# Patient Record
Sex: Male | Born: 1975 | Race: White | Hispanic: No | Marital: Married | Smoking: Current some day smoker
Health system: Southern US, Community
[De-identification: ages and names within clinical notes are randomized; demographics above are authoritative.]

---

## 2012-12-18 ENCOUNTER — Ambulatory Visit: Payer: Self-pay | Admitting: Unknown Physician Specialty

## 2014-10-28 ENCOUNTER — Ambulatory Visit
Admission: EM | Admit: 2014-10-28 | Discharge: 2014-10-28 | Disposition: A | Discharge status: Home / Self Care | Payer: BLUE CROSS/BLUE SHIELD | Attending: Family Medicine | Admitting: Family Medicine

## 2014-10-28 ENCOUNTER — Ambulatory Visit: Payer: BLUE CROSS/BLUE SHIELD

## 2014-10-28 DIAGNOSIS — R05 Cough: Secondary | ICD-10-CM | POA: Diagnosis present

## 2014-10-28 DIAGNOSIS — J209 Acute bronchitis, unspecified: Secondary | ICD-10-CM | POA: Diagnosis not present

## 2014-10-28 DIAGNOSIS — F17210 Nicotine dependence, cigarettes, uncomplicated: Secondary | ICD-10-CM | POA: Diagnosis not present

## 2014-10-28 MED ORDER — AEROCHAMBER PLUS FLO-VU LARGE MISC: 1.0000 | Freq: Once | Status: AC

## 2014-10-28 MED ORDER — PREDNISONE 10 MG PO TABS: ORAL_TABLET | ORAL | Status: AC

## 2014-10-28 MED ORDER — ALBUTEROL SULFATE HFA 108 (90 BASE) MCG/ACT IN AERS: 2.0000 | INHALATION_SPRAY | RESPIRATORY_TRACT | Status: AC | PRN

## 2014-10-28 MED ORDER — IPRATROPIUM-ALBUTEROL 0.5-2.5 (3) MG/3ML IN SOLN
3.0000 mL | Freq: Once | RESPIRATORY_TRACT | Status: AC
  Administered 2014-10-28: 3 mL via RESPIRATORY_TRACT

## 2014-10-28 MED ORDER — AZITHROMYCIN 250 MG PO TABS: ORAL_TABLET | ORAL | Status: AC

## 2014-10-28 MED ORDER — HYDROCOD POLST-CPM POLST ER 10-8 MG/5ML PO SUER: 5.0000 mL | Freq: Two times a day (BID) | ORAL | Status: AC | PRN

## 2014-10-28 NOTE — ED Notes (Signed)
Started last Thursday with slight cough. Now moved "deep into my chest". Denies fever

## 2014-10-28 NOTE — Discharge Instructions (Signed)

## 2014-10-28 NOTE — ED Provider Notes (Signed)
CSN: [id number]     Arrival date & time 10/28/14  0802 History   First MD Initiated Contact with Patient 10/28/14 [phone]     Chief Complaint  Patient presents with  . Cough   (Consider location/radiation/quality/duration/timing/severity/associated sxs/prior Treatment) HPI       39 year old male presents complaining of cough. This started initially 6 days ago. He had a mild dry cough with some mild fatigue for about 4 days. 2 days ago the cough got a lot worse. Now he is experiencing chest pain with coughing. He has a heaviness sensation in his chest after coughing as well. He has had a couple of episodes of posttussive vomiting. He has close contacts with similar illness, his boss had bronchitis which turned into pneumonia eventually. He denies fever, chills, NVD, or shortness of breath. No recent travel. Over-the-counter medications are not helping. He smokes about 1 pack of cigarettes a day.  History reviewed. No pertinent past medical history. History reviewed. No pertinent past surgical history. History reviewed. No pertinent family history. History  Substance Use Topics  . Smoking status: Current Some Day Smoker -- 0.50 packs/day  . Smokeless tobacco: Not on file  . Alcohol Use: Yes     Comment: socially    Review of Systems  Constitutional: Positive for fatigue. Negative for fever and chills.  HENT: Positive for congestion. Negative for rhinorrhea and sore throat.   Respiratory: Positive for cough and chest tightness. Negative for shortness of breath.   Cardiovascular: Negative for chest pain and leg swelling.  Gastrointestinal: Negative for nausea, vomiting, abdominal pain and diarrhea.  Skin: Negative for rash.  All other systems reviewed and are negative.   Allergies  Shrimp  Home Medications   Prior to Admission medications   Medication Sig Start Date End Date Taking? Authorizing Provider  albuterol (PROVENTIL HFA;VENTOLIN HFA) 108 (90 BASE) MCG/ACT inhaler Inhale 2  puffs into the lungs every 4 (four) hours as needed for wheezing. 10/28/14   Graylon GoodZachary H Joevon Holliman, PA-C  azithromycin (ZITHROMAX Z-PAK) 250 MG tablet Use as directed 10/28/14   Graylon GoodZachary H Mate Alegria, PA-C  chlorpheniramine-HYDROcodone (TUSSIONEX PENNKINETIC ER) 10-8 MG/5ML SUER Take 5 mLs by mouth every 12 (twelve) hours as needed for cough. 10/28/14   Graylon GoodZachary H Finnbar Cedillos, PA-C  predniSONE (DELTASONE) 10 MG tablet 4 tabs PO QD for 4 days; 3 tabs PO QD for 3 days; 2 tabs PO QD for 2 days; 1 tab PO QD for 1 day 10/28/14   Graylon GoodZachary H Elie Leppo, PA-C  Spacer/Aero-Holding Chambers (AEROCHAMBER PLUS FLO-VU LARGE) MISC 1 each by Other route once. 10/28/14   Adrian BlackwaterZachary H Eldene Plocher, PA-C   BP 126/75 mmHg  Pulse 68  Temp(Src) 97.7 F (36.5 C) (Oral)  Resp 18  Ht 5\' 6"  (1.676 m)  Wt 165 lb (74.844 kg)  BMI 26.64 kg/m2  SpO2 100% Physical Exam  Constitutional: He is oriented to person, place, and time. He appears well-developed and well-nourished. He appears distressed (uncomfortable appearing).  HENT:  Head: Normocephalic and atraumatic.  Right Ear: External ear normal.  Left Ear: External ear normal.  Nose: Nose normal. Right sinus exhibits no maxillary sinus tenderness and no frontal sinus tenderness. Left sinus exhibits no maxillary sinus tenderness and no frontal sinus tenderness.  Mouth/Throat: Oropharynx is clear and moist. No oropharyngeal exudate.  Eyes: Conjunctivae are normal.  Neck: Normal range of motion. Neck supple.  Cardiovascular: Normal rate, regular rhythm and normal heart sounds.   Pulmonary/Chest: Effort normal and breath sounds normal. No respiratory  distress. He has no wheezes. He has no rales.  Lymphadenopathy:    He has no cervical adenopathy.  Neurological: He is alert and oriented to person, place, and time. Coordination normal.  Skin: Skin is warm and dry. No rash noted. He is not diaphoretic.  Psychiatric: He has a normal mood and affect. Judgment normal.  Nursing note and vitals reviewed.   ED  Course  Procedures (including critical care time) Labs Review Labs Reviewed - No data to display  Imaging Review Dg Chest 2 View  10/28/2014   CLINICAL DATA:  Cough, shortness of breath for 3 days  EXAM: CHEST  2 VIEW  COMPARISON:  None.  FINDINGS: Cardiomediastinal silhouette is unremarkable. No acute infiltrate or pleural effusion. No pulmonary edema. Minimal degenerative changes lower thoracic spine.  IMPRESSION: No active cardiopulmonary disease.   Electronically Signed   By: Natasha Mead M.D.   On: 10/28/2014 09:00     MDM   1. Acute bronchitis, unspecified organism    He had minimal symptomatic improvement with the DuoNeb nebulizer treatment. No radiographic evidence of pneumonia. However, he is a heavy smoker smoking about a pack a day. Given that, we will go ahead and treat with antibiotics. Also treat symptomatically. Follow-up if worsening   Meds ordered this encounter  Medications  . ipratropium-albuterol (DUONEB) 0.5-2.5 (3) MG/3ML nebulizer solution 3 mL    Sig:   . azithromycin (ZITHROMAX Z-PAK) 250 MG tablet    Sig: Use as directed    Dispense:  6 each    Refill:  0  . predniSONE (DELTASONE) 10 MG tablet    Sig: 4 tabs PO QD for 4 days; 3 tabs PO QD for 3 days; 2 tabs PO QD for 2 days; 1 tab PO QD for 1 day    Dispense:  30 tablet    Refill:  0  . chlorpheniramine-HYDROcodone (TUSSIONEX PENNKINETIC ER) 10-8 MG/5ML SUER    Sig: Take 5 mLs by mouth every 12 (twelve) hours as needed for cough.    Dispense:  115 mL    Refill:  0  . albuterol (PROVENTIL HFA;VENTOLIN HFA) 108 (90 BASE) MCG/ACT inhaler    Sig: Inhale 2 puffs into the lungs every 4 (four) hours as needed for wheezing.    Dispense:  1 Inhaler    Refill:  0  . Spacer/Aero-Holding Chambers (AEROCHAMBER PLUS FLO-VU LARGE) MISC    Sig: 1 each by Other route once.    Dispense:  1 each    Refill:  0     Graylon Good, PA-C 10/28/14 0917  Graylon Good, PA-C 10/28/14 [phone]

## 2016-03-07 IMAGING — CR DG CHEST 2V
2 series · 2 of 2 positions shown · non-contrast
Comparison: None.

CLINICAL DATA: Cough, shortness of breath for 3 days

EXAM:
CHEST  2 VIEW

[chest pa]
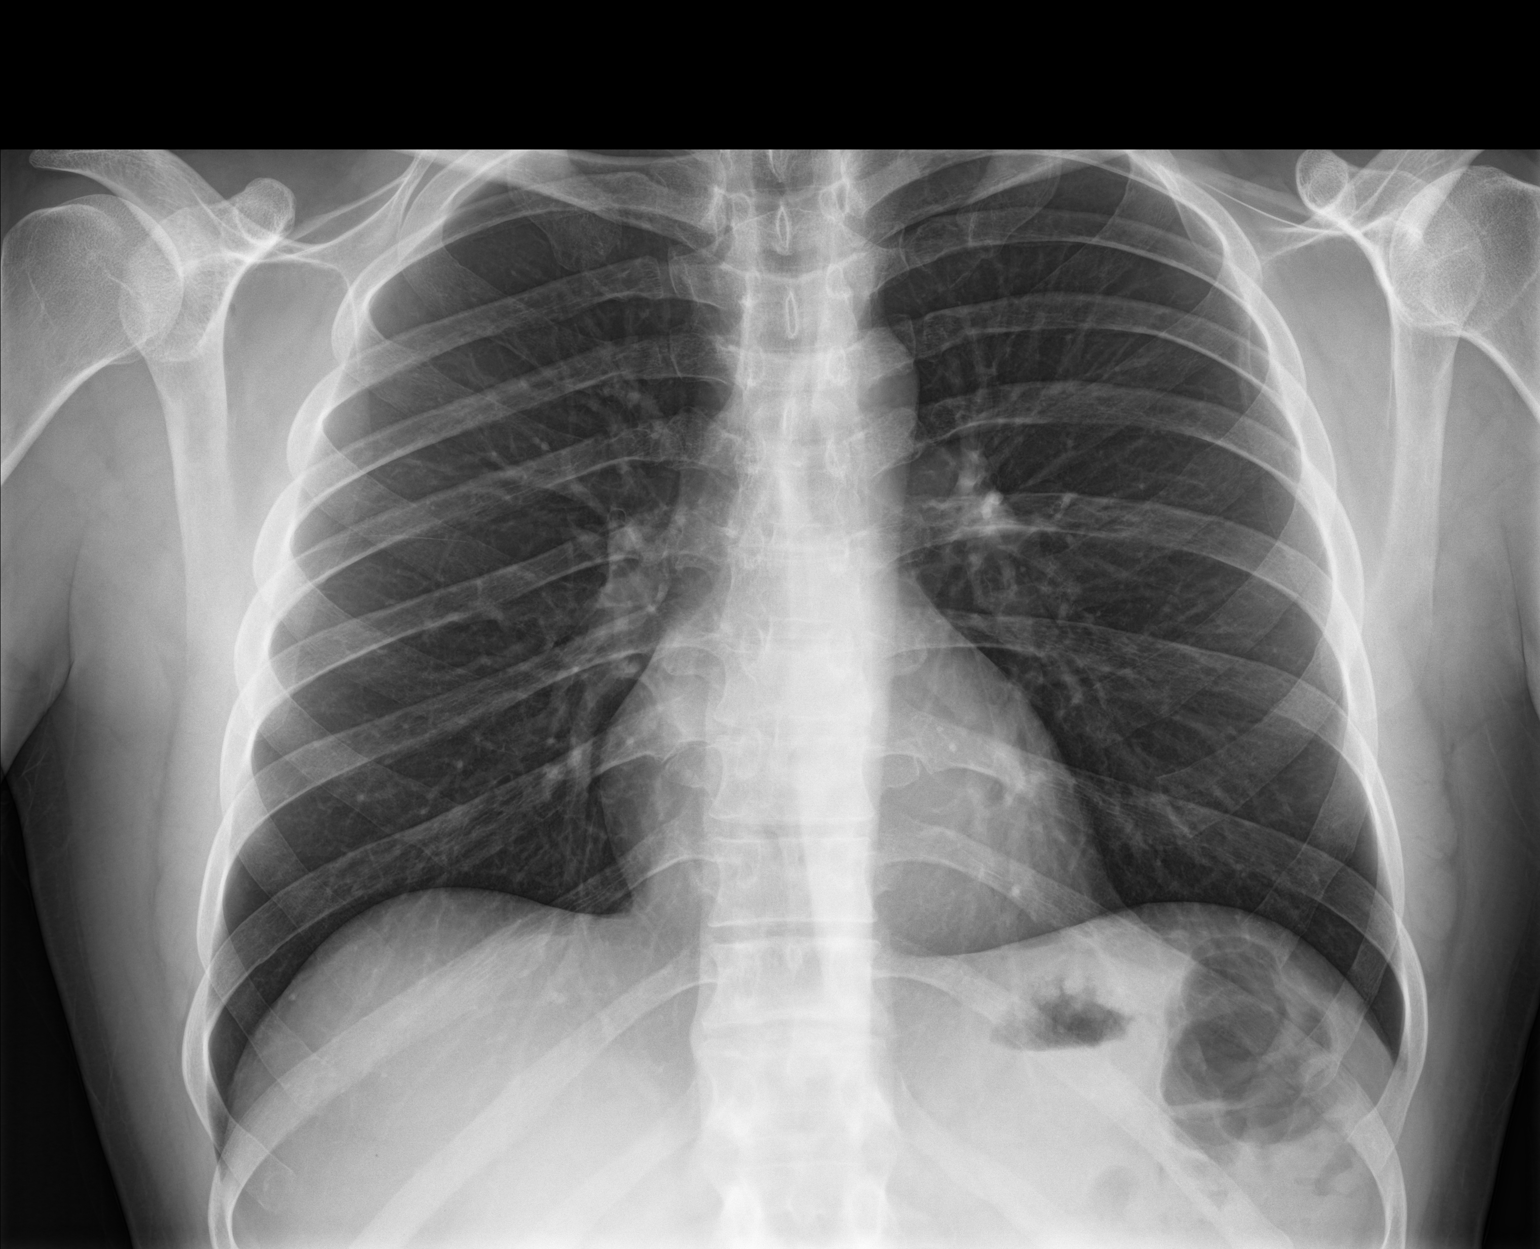

[chest lat]
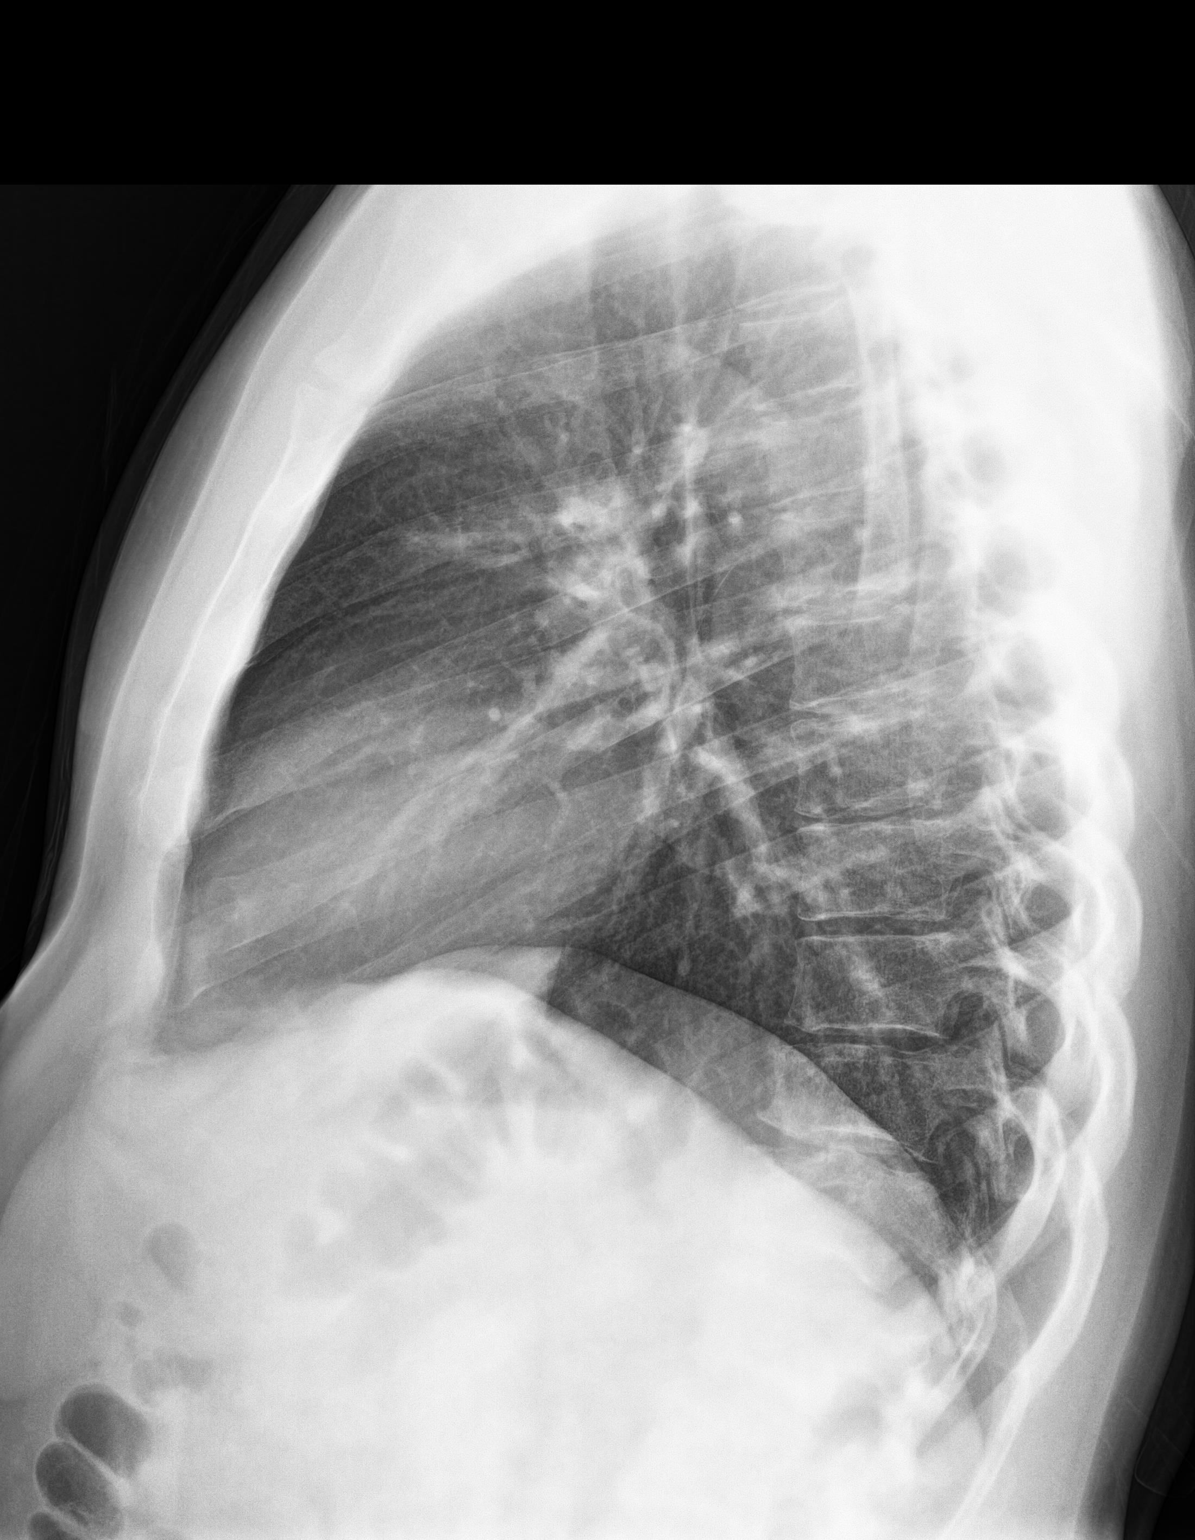

[2 of 2 positions shown; findings below may reference images not displayed]

FINDINGS: Cardiomediastinal silhouette is unremarkable. No acute infiltrate or
pleural effusion. No pulmonary edema. Minimal degenerative changes
lower thoracic spine.
IMPRESSION: No active cardiopulmonary disease.
